# Patient Record
Sex: Female | Born: 1975 | Race: Black or African American | Hispanic: No | Marital: Married
Health system: Southern US, Community
[De-identification: ages and names within clinical notes are randomized; demographics above are authoritative.]

## PROBLEM LIST (undated history)

## (undated) DIAGNOSIS — F410 Panic disorder [episodic paroxysmal anxiety] without agoraphobia: Secondary | ICD-10-CM

## (undated) DIAGNOSIS — F419 Anxiety disorder, unspecified: Secondary | ICD-10-CM

## (undated) DIAGNOSIS — N911 Secondary amenorrhea: Secondary | ICD-10-CM

## (undated) DIAGNOSIS — E669 Obesity, unspecified: Secondary | ICD-10-CM

## (undated) HISTORY — DX: Secondary amenorrhea: N91.1

## (undated) HISTORY — DX: Panic disorder (episodic paroxysmal anxiety): F41.0

## (undated) HISTORY — DX: Obesity, unspecified: E66.9

## (undated) HISTORY — DX: Anxiety disorder, unspecified: F41.9

---

## 1998-06-10 ENCOUNTER — Emergency Department (HOSPITAL_COMMUNITY): Admission: EM | Admit: 1998-06-10 | Discharge: 1998-06-10 | Payer: Self-pay | Admitting: Emergency Medicine

## 2001-09-07 ENCOUNTER — Emergency Department (HOSPITAL_COMMUNITY): Admission: EM | Admit: 2001-09-07 | Discharge: 2001-09-07 | Payer: Self-pay | Admitting: Emergency Medicine

## 2001-11-01 ENCOUNTER — Emergency Department (HOSPITAL_COMMUNITY): Admission: EM | Admit: 2001-11-01 | Discharge: 2001-11-01 | Payer: Self-pay | Admitting: Emergency Medicine

## 2003-02-16 ENCOUNTER — Emergency Department (HOSPITAL_COMMUNITY): Admission: EM | Admit: 2003-02-16 | Discharge: 2003-02-16 | Payer: Self-pay | Admitting: Emergency Medicine

## 2003-07-04 ENCOUNTER — Emergency Department (HOSPITAL_COMMUNITY): Admission: EM | Admit: 2003-07-04 | Discharge: 2003-07-05 | Payer: Self-pay | Admitting: Emergency Medicine

## 2004-08-26 ENCOUNTER — Emergency Department (HOSPITAL_COMMUNITY): Admission: EM | Admit: 2004-08-26 | Discharge: 2004-08-26 | Payer: Self-pay | Admitting: Emergency Medicine

## 2004-10-23 ENCOUNTER — Inpatient Hospital Stay (HOSPITAL_COMMUNITY): Admission: AD | Admit: 2004-10-23 | Discharge: 2004-10-24 | Payer: Self-pay | Admitting: Obstetrics and Gynecology

## 2004-10-27 ENCOUNTER — Inpatient Hospital Stay (HOSPITAL_COMMUNITY): Admission: AD | Admit: 2004-10-27 | Discharge: 2004-10-27 | Payer: Self-pay | Admitting: Obstetrics and Gynecology

## 2004-12-03 ENCOUNTER — Inpatient Hospital Stay (HOSPITAL_COMMUNITY): Admission: AD | Admit: 2004-12-03 | Discharge: 2004-12-03 | Payer: Self-pay | Admitting: Obstetrics and Gynecology

## 2004-12-04 ENCOUNTER — Inpatient Hospital Stay (HOSPITAL_COMMUNITY): Admission: AD | Admit: 2004-12-04 | Discharge: 2004-12-04 | Payer: Self-pay | Admitting: *Deleted

## 2004-12-29 ENCOUNTER — Inpatient Hospital Stay (HOSPITAL_COMMUNITY): Admission: AD | Admit: 2004-12-29 | Discharge: 2004-12-29 | Payer: Self-pay | Admitting: Obstetrics & Gynecology

## 2005-01-05 ENCOUNTER — Inpatient Hospital Stay (HOSPITAL_COMMUNITY): Admission: AD | Admit: 2005-01-05 | Discharge: 2005-01-05 | Payer: Self-pay | Admitting: Obstetrics and Gynecology

## 2005-01-29 ENCOUNTER — Inpatient Hospital Stay (HOSPITAL_COMMUNITY): Admission: AD | Admit: 2005-01-29 | Discharge: 2005-02-03 | Payer: Self-pay | Admitting: Obstetrics & Gynecology

## 2005-01-31 ENCOUNTER — Encounter (INDEPENDENT_AMBULATORY_CARE_PROVIDER_SITE_OTHER): Payer: Self-pay | Admitting: Specialist

## 2005-02-04 ENCOUNTER — Encounter: Admission: RE | Admit: 2005-02-04 | Discharge: 2005-03-06 | Payer: Self-pay | Admitting: Obstetrics & Gynecology

## 2005-03-07 ENCOUNTER — Encounter: Admission: RE | Admit: 2005-03-07 | Discharge: 2005-04-06 | Payer: Self-pay | Admitting: Obstetrics & Gynecology

## 2005-04-07 ENCOUNTER — Encounter: Admission: RE | Admit: 2005-04-07 | Discharge: 2005-05-04 | Payer: Self-pay | Admitting: Obstetrics & Gynecology

## 2005-05-05 ENCOUNTER — Encounter: Admission: RE | Admit: 2005-05-05 | Discharge: 2005-06-04 | Payer: Self-pay | Admitting: Obstetrics & Gynecology

## 2005-06-05 ENCOUNTER — Encounter: Admission: RE | Admit: 2005-06-05 | Discharge: 2005-07-04 | Payer: Self-pay | Admitting: Obstetrics & Gynecology

## 2005-07-05 ENCOUNTER — Encounter: Admission: RE | Admit: 2005-07-05 | Discharge: 2005-08-04 | Payer: Self-pay | Admitting: Obstetrics & Gynecology

## 2005-08-05 ENCOUNTER — Encounter: Admission: RE | Admit: 2005-08-05 | Discharge: 2005-09-03 | Payer: Self-pay | Admitting: Obstetrics & Gynecology

## 2005-09-04 ENCOUNTER — Encounter: Admission: RE | Admit: 2005-09-04 | Discharge: 2005-10-04 | Payer: Self-pay | Admitting: Obstetrics & Gynecology

## 2005-10-05 ENCOUNTER — Encounter: Admission: RE | Admit: 2005-10-05 | Discharge: 2005-11-04 | Payer: Self-pay | Admitting: Obstetrics & Gynecology

## 2005-11-05 ENCOUNTER — Encounter: Admission: RE | Admit: 2005-11-05 | Discharge: 2005-12-04 | Payer: Self-pay | Admitting: Obstetrics & Gynecology

## 2005-12-05 ENCOUNTER — Encounter: Admission: RE | Admit: 2005-12-05 | Discharge: 2006-01-04 | Payer: Self-pay | Admitting: Obstetrics & Gynecology

## 2006-01-05 ENCOUNTER — Encounter: Admission: RE | Admit: 2006-01-05 | Discharge: 2006-02-03 | Payer: Self-pay | Admitting: Obstetrics & Gynecology

## 2006-01-24 ENCOUNTER — Emergency Department (HOSPITAL_COMMUNITY): Admission: EM | Admit: 2006-01-24 | Discharge: 2006-01-24 | Payer: Self-pay | Admitting: Emergency Medicine

## 2006-02-04 ENCOUNTER — Encounter: Admission: RE | Admit: 2006-02-04 | Discharge: 2006-03-06 | Payer: Self-pay | Admitting: Obstetrics & Gynecology

## 2006-03-07 ENCOUNTER — Encounter: Admission: RE | Admit: 2006-03-07 | Discharge: 2006-04-06 | Payer: Self-pay | Admitting: Obstetrics & Gynecology

## 2006-06-09 ENCOUNTER — Ambulatory Visit: Payer: Self-pay | Admitting: Family Medicine

## 2006-06-10 ENCOUNTER — Other Ambulatory Visit: Admission: RE | Admit: 2006-06-10 | Discharge: 2006-06-10 | Payer: Self-pay | Admitting: Family Medicine

## 2007-12-20 ENCOUNTER — Emergency Department (HOSPITAL_COMMUNITY): Admission: EM | Admit: 2007-12-20 | Discharge: 2007-12-21 | Payer: Self-pay | Admitting: Emergency Medicine

## 2008-01-24 ENCOUNTER — Emergency Department (HOSPITAL_COMMUNITY): Admission: EM | Admit: 2008-01-24 | Discharge: 2008-01-24 | Payer: Self-pay | Admitting: Emergency Medicine

## 2008-02-03 ENCOUNTER — Emergency Department (HOSPITAL_COMMUNITY): Admission: EM | Admit: 2008-02-03 | Discharge: 2008-02-03 | Payer: Self-pay | Admitting: Emergency Medicine

## 2008-02-09 ENCOUNTER — Emergency Department (HOSPITAL_COMMUNITY): Admission: EM | Admit: 2008-02-09 | Discharge: 2008-02-09 | Payer: Self-pay | Admitting: Emergency Medicine

## 2008-07-30 ENCOUNTER — Emergency Department (HOSPITAL_COMMUNITY): Admission: EM | Admit: 2008-07-30 | Discharge: 2008-07-30 | Payer: Self-pay | Admitting: Emergency Medicine

## 2008-08-30 ENCOUNTER — Emergency Department (HOSPITAL_COMMUNITY): Admission: EM | Admit: 2008-08-30 | Discharge: 2008-08-30 | Payer: Self-pay | Admitting: Emergency Medicine

## 2008-09-06 ENCOUNTER — Emergency Department (HOSPITAL_COMMUNITY): Admission: EM | Admit: 2008-09-06 | Discharge: 2008-09-06 | Payer: Self-pay | Admitting: Family Medicine

## 2010-03-14 ENCOUNTER — Ambulatory Visit: Admit: 2010-03-14 | Payer: Self-pay | Admitting: Family Medicine

## 2010-03-14 ENCOUNTER — Ambulatory Visit: Payer: 59 | Admitting: Physician Assistant

## 2010-03-14 DIAGNOSIS — Z3009 Encounter for other general counseling and advice on contraception: Secondary | ICD-10-CM

## 2010-03-14 DIAGNOSIS — E669 Obesity, unspecified: Secondary | ICD-10-CM

## 2010-03-14 DIAGNOSIS — R6882 Decreased libido: Secondary | ICD-10-CM

## 2010-03-14 DIAGNOSIS — Z0000 Encounter for general adult medical examination without abnormal findings: Secondary | ICD-10-CM

## 2010-04-20 ENCOUNTER — Emergency Department (HOSPITAL_COMMUNITY)
Admission: EM | Admit: 2010-04-20 | Discharge: 2010-04-20 | Disposition: A | Discharge status: Home / Self Care | Payer: 59 | Attending: Emergency Medicine | Admitting: Emergency Medicine

## 2010-04-20 DIAGNOSIS — K089 Disorder of teeth and supporting structures, unspecified: Secondary | ICD-10-CM | POA: Insufficient documentation

## 2010-04-20 DIAGNOSIS — K029 Dental caries, unspecified: Secondary | ICD-10-CM | POA: Insufficient documentation

## 2010-05-21 LAB — RAPID STREP SCREEN (MED CTR MEBANE ONLY): Streptococcus, Group A Screen (Direct): NEGATIVE

## 2010-06-06 ENCOUNTER — Other Ambulatory Visit (INDEPENDENT_AMBULATORY_CARE_PROVIDER_SITE_OTHER): Payer: 59

## 2010-06-06 DIAGNOSIS — Z3009 Encounter for other general counseling and advice on contraception: Secondary | ICD-10-CM

## 2010-06-29 NOTE — Op Note (Signed)
NAME:  Cathy Flores, Cathy Flores                 ACCOUNT NO.:  [id number]   MEDICAL RECORD NO.:  [id number]          PATIENT TYPE:  INP   LOCATION:  9123                          FACILITY:  [facility]   PHYSICIAN:  Maxie Better, M.D.DATE OF BIRTH:  16-May-1975   DATE OF PROCEDURE:  01/31/2005  DATE OF DISCHARGE:                                 OPERATIVE REPORT   PREOPERATIVE DIAGNOSES:  1.  Non-reassuring fetal tracing.  2.  Post dates.   OPERATION/PROCEDURE:  1.  Emergency primary cesarean section.  2.  Removal of omental lesion.   POSTOPERATIVE DIAGNOSES:  1.  Non-reassuring fetal tracing.  2.  Post dates.  3.  Hyperextended right occiput posterior presentation.   ANESTHESIA:  Epidural.   SURGEON:  Maxie Better, M.D.   ASSISTANT:  Hal Morales, M.D.   OPERATION/PROCEDURE:  Under adequate epidural anesthesia, the patient was  placed in the supine position with a right lateral tilt.  On arrival in the  operating room, fetal heart rate was back up to about 110-120's and  vacillating down to the 100's.  Prior to transfer to the operating room, the  fetal heart rate is in the 60's.  An indwelling Foley catheter was already  in place.  The patient had been  sterilely prepped and draped quickly.  Pfannenstiel skin incision was then made, carried down to the rectus fascia.  Rectus fascia was incised in the midline, extended transversely.  The rectus  fascia was then bluntly and sharply dissected off the rectus muscle in  superior and inferior fashion.  The rectus muscle was split in the midline.  The parietal peritoneum was entered sharply and extended.  The vesicouterine  peritoneum then opened.  Well-developed  lower uterine segment was noted.  A  curvilinear low transverse uterine incision was then made and extended with  bandage scissors.  Subsequent delivery of a live female from the right  occiput posterior presentation with hyperextended vertex was accomplished.  The baby was  bulb-suctioned on the abdomen.  Cord was clamped, cut and the  baby was transferred to the awaiting pediatrician who assigned Apgars of 8  and 9 at one and five minutes.  Cord PH was obtained which was 7.23.  Weight  of the baby was 7 pounds 2 ounces.  The placenta was spontaneous and intact.  The uterine cavity was cleaned of debris.  Normal tubes and ovaries were  noted bilaterally.  The uterine incision was closed with in two layers, the  first layer with 0 Monocryl, running locked stitch.  Second layer was  imbricating using 0 Monocryl suture.  Good hemostasis was noted.  The  abdomen was then copiously irrigated and suctioned of debris.  The right  omentum presented in the field and had a pearl-shaped 1.5 cm nodule which  was subsequently removed separately and sent to pathology.  The parietal  peritoneum was then closed.  The rectus fascia was closed with 0 Vicryl x2.  The subcutaneous area was irrigated and suctioned and small bleeders  cauterized.  The subcutaneous area was approximated using interrupted 2-0  plain  sutures.  The skin was approximated using Ethicon staples.   Specimen was placenta sent to pathology as well as the omental nodule.  Intraoperative fluid was 1200 mL.  Urine output was 300 mL.  Estimated blood  loss was 600 mL.  No complications.  The patient tolerated the procedure  well and was transferred to the recovery room in stable condition.  The  patient underwent x-ray due to the emergency setting.      Maxie Better, M.D.  Electronically Signed     [place]/MEDQ  D:  01/31/2005  T:  02/01/2005  Job:  [id number]

## 2010-06-29 NOTE — Discharge Summary (Signed)
[name]                 ACCOUNT NO.:  [id number]   MEDICAL RECORD NO.:  [id number]          PATIENT TYPE:  INP   LOCATION:  9123                          FACILITY:  [facility]   PHYSICIAN:  Maxie Better, M.D.DATE OF BIRTH:  05/05/1975   DATE OF ADMISSION:  01/29/2005  DATE OF DISCHARGE:  02/03/2005                                 DISCHARGE SUMMARY   ADMISSION DIAGNOSES:  1.  Post dates.  2.  Exogenous obesity.   DISCHARGE DIAGNOSES:  1.  Post date delivered.  2.  Postoperative anemia.  3.  Exogenous obesity.   PROCEDURE:  Emergency primary cesarean section with removal of an omental  lesion.   HISTORY OF PRESENT ILLNESS:  A 35 year old G1, P0 female at 31 5/7 weeks,  admitted by Dr. Seymour Bars for induction of labor, secondary to post dates.   HOSPITAL COURSE:  The patient was admitted to [facility].  At that  time, her cervix was 1, 60%, -3.  She had a reactive nonstress test.  Cervidil was placed for cervical ripening and on 01/30/05, the Cervidil was  removed, Pitocin was started and the patient was continued on her Pitocin.  Artificial rupture of membranes was performed.  The cervix at that time was  2, 70%, -2.  The fluid was clear.  Intrauterine pressure catheter was  subsequently placed.  The findings was persistent latent phase secondary to  suboptimal contractions.  The patient ultimately progressed to 6-7 cm, 80%,  -1.  She was having some intermittent variable decelerations at that time.  The patient subsequently had a fetal heart rate deceleration down to the 90s  from a baseline of 117 to 120, which was not responsive to positional  change, abdominal stimulation, scalp stimulation and subsequently went on to  decrease to the 60s.  The decision was made to perform an emergency primary  cesarean section.  The patient was taken to the operating room.  She had a  primary low transverse cesarean section, which resulted in the delivery of a  7 pound 2 ounce  live female from the occiput posterior presentation with a  hyperextended head.  Normal 2's and O's were noted at the time.  Apgars of 8  and 9.  Cord pH of 7.23.  There was a 1.5 cm pearl like nodule removed from  the omentum and sent to pathology.  The patient is known to be Rh negative.  RhoGAM was given.  The pathology showed the nonadipose tissue with a rim of  dense fibrous tissue for the omental lesion.  The placenta, which had been  sent, showed acute chorioamnionitis.  The patient had an uncomplicated  postoperative course.  A CBC on postoperative day #1 showed a hemoglobin of  9.4, hematocrit 27.7, white count 24.1.  By postoperative day #3, the  patient was afebrile, tolerating a regular diet.  The incision showed no  erythema, induration or exudate.  She was deemed well enough to be  discharged.   DISPOSITION:  Home.   CONDITION:  Stable.   DISCHARGE MEDICATIONS:  1.  Tylox 1-2 tablets q.4-6h.  2.  Over-the-counter Motrin 600 mg, 1 q.6h. p.r.n. pain.  3.  Prenatal vitamins, 1 p.o. daily.  4.  Over-the-counter iron supplementation, 1 p.o. daily.   DISCHARGE INSTRUCTIONS:  Per the postpartum booklet given to the patient and  reviewed with the patient.   FOLLOWUP APPOINTMENT:  For staple removal in the office on Tuesday or  Wednesday of the following week and a regular postpartum appointment in 4-6  weeks.      Maxie Better, M.D.  Electronically Signed     [place]/MEDQ  D:  02/17/2005  T:  02/18/2005  Job:  [id number]

## 2010-08-24 ENCOUNTER — Encounter: Payer: Self-pay | Admitting: Family Medicine

## 2010-08-28 ENCOUNTER — Other Ambulatory Visit: Payer: 59

## 2010-09-26 ENCOUNTER — Ambulatory Visit: Payer: 59 | Admitting: Medical

## 2010-11-09 ENCOUNTER — Ambulatory Visit: Payer: 59 | Admitting: Medical

## 2010-12-21 ENCOUNTER — Encounter: Payer: Self-pay | Admitting: Medical

## 2010-12-21 ENCOUNTER — Ambulatory Visit (INDEPENDENT_AMBULATORY_CARE_PROVIDER_SITE_OTHER): Payer: 59 | Admitting: Medical

## 2010-12-21 ENCOUNTER — Telehealth: Payer: Self-pay | Admitting: Medical

## 2010-12-21 ENCOUNTER — Ambulatory Visit
Admission: RE | Admit: 2010-12-21 | Discharge: 2010-12-21 | Disposition: A | Discharge status: Home / Self Care | Payer: 59 | Source: Ambulatory Visit | Attending: Medical | Admitting: Medical

## 2010-12-21 VITALS — BP 100/80 | HR 114 | Temp 98.7°F | Resp 20 | Wt 270.0 lb

## 2010-12-21 DIAGNOSIS — R059 Cough, unspecified: Secondary | ICD-10-CM | POA: Insufficient documentation

## 2010-12-21 DIAGNOSIS — R05 Cough: Secondary | ICD-10-CM

## 2010-12-21 DIAGNOSIS — R042 Hemoptysis: Secondary | ICD-10-CM

## 2010-12-21 LAB — CBC WITH DIFFERENTIAL/PLATELET
HCT: 38.5 % (ref 36.0–46.0)
Hemoglobin: 12.1 g/dL (ref 12.0–15.0)
Lymphocytes Relative: 12 % (ref 12–46)
Lymphs Abs: 3 10*3/uL (ref 0.7–4.0)
MCH: 28.9 pg (ref 26.0–34.0)
MCHC: 31.5 g/dL (ref 30.0–36.0)
MCV: 91.9 fL (ref 78.0–100.0)
Monocytes Absolute: 0.5 10*3/uL (ref 0.1–1.0)
Monocytes Relative: 2 % — ABNORMAL LOW (ref 3–12)
Neutrophils Relative %: 86 % — ABNORMAL HIGH (ref 43–77)
Platelets: 276 10*3/uL (ref 150–400)
RBC: 4.19 MIL/uL (ref 3.87–5.11)

## 2010-12-21 MED ORDER — LEVOFLOXACIN 500 MG PO TABS: 500.0000 mg | ORAL_TABLET | Freq: Every day | ORAL | Status: AC

## 2010-12-21 MED ORDER — HYDROCODONE-HOMATROPINE 5-1.5 MG/5ML PO SYRP: 5.0000 mL | ORAL_SOLUTION | Freq: Four times a day (QID) | ORAL | Status: AC | PRN

## 2010-12-21 NOTE — Patient Instructions (Signed)

## 2010-12-21 NOTE — Progress Notes (Signed)
Subjective:   HPI  Cathy Flores is a 35 y.o. female who presents with two-week history of cough that is worsening.  She has been in her usual state of health and usually doesn't frequent the doctor's office up until the last 2 weeks. She has been using over-the-counter Delsym and expectorant, has been getting up some sputum, but today she's had 2 episodes were she saw a small amount of somewhat bright and liquid red blood in with the sputum.  She also notes notes subjective fever, sweats, feeling cold and hot, achy, a little short of breath and wheezing. Her daughter has been sick with a cold. Her throat feels like it's on fire, she does have some ear pressure and sinus pressure and headache. She denies use of recent NSAIDs, no recent long travel, no calf pain, no recent surgery or procedure, not on hormone therapy, no history of DVT or PE.  No other aggravating or relieving factors.    No other c/o.  The following portions of the patient's history were reviewed and updated as appropriate: allergies, current medications, past family history, past medical history, past social history, past surgical history and problem list.  No past medical history on file.   Review of Systems Constitutional: _+fever, +chills, +sweats, -unexpected -weight change,+fatigue ENT: +runny nose, -ear pain, +sore throat Cardiology:  -chest pain, -palpitations, -edema Respiratory: +cough, -shortness of breath, +wheezing Gastroenterology: -abdominal pain, -nausea, +vomiting, -diarrhea, -constipation Hematology: -bleeding or bruising problems Musculoskeletal: -arthralgias, -myalgias, -joint swelling, -back pain Ophthalmology: -vision changes Urology: -dysuria, -difficulty urinating, -hematuria, -urinary frequency, -urgency Neurology: +headache, -weakness, -tingling, -numbness    Objective:   Physical Exam  Filed Vitals:   12/21/10 1335  BP: 100/60  Pulse: 102  Temp: 98.7 F (37.1 C)  Resp: 20    General  appearance: alert, no distress, WD/WN, obese black female HEENT: normocephalic, sclerae anicteric, TMs pearly, nares with swollen turbinates and some mucopurulent discharge and erythema, pharynx with mild erythema Oral cavity: MMM, no lesions Neck: supple, no lymphadenopathy, no thyromegaly, no masses Heart: RRR, normal S1, S2, no murmurs Lungs:+rales and rhonchi right lower lung fields, otherwise decreased breath sounds in general Ext: no calve tenderness Pulses: 2+ symmetric, upper and lower extremities, normal cap refill   Assessment and Plan :    Encounter Diagnoses  Name Primary?  . Cough Yes  . Blood in sputum    Orthostatics vitals not worrisome, Pulse ox 93% average.  Will send for chest xray now and CBC.  We'll treat empirically for pneumonia,, description for Hydromet cough syrup and Levaquin, advise rest, supportive care, recheck in 7-10 days.

## 2010-12-28 NOTE — Telephone Encounter (Signed)
FYI. CLOSED OVER 1 WK OLD

## 2010-12-28 NOTE — Progress Notes (Signed)
12/28/10-LMOM FOR THE PATIENT TO CB. CLS X3 CALLING

## 2011-01-04 NOTE — Progress Notes (Signed)
I TRIED TO CALL THIS PATIENT THREE TIMES TO MAKE SURE THAT SHE WAS DOING OKAY AND TO F/U WITH SHANE BUT NO RETURN CALL FROM THE PATIENT. CLS

## 2011-03-01 ENCOUNTER — Institutional Professional Consult (permissible substitution): Payer: 59 | Admitting: Medical

## 2011-03-13 ENCOUNTER — Institutional Professional Consult (permissible substitution): Payer: 59 | Admitting: Medical

## 2011-05-29 ENCOUNTER — Ambulatory Visit (INDEPENDENT_AMBULATORY_CARE_PROVIDER_SITE_OTHER): Payer: 59 | Admitting: Medical

## 2011-05-29 ENCOUNTER — Encounter: Payer: Self-pay | Admitting: Medical

## 2011-05-29 VITALS — BP 120/70 | HR 68 | Temp 98.5°F | Resp 16 | Wt 277.0 lb

## 2011-05-29 DIAGNOSIS — F430 Acute stress reaction: Secondary | ICD-10-CM

## 2011-05-29 DIAGNOSIS — F438 Other reactions to severe stress: Secondary | ICD-10-CM

## 2011-05-29 MED ORDER — ALPRAZOLAM 0.25 MG PO TABS: ORAL_TABLET | ORAL | Status: DC

## 2011-05-29 MED ORDER — CITALOPRAM HYDROBROMIDE 20 MG PO TABS: ORAL_TABLET | ORAL | Status: DC

## 2011-05-29 NOTE — Progress Notes (Signed)
Subjective: Here for c/o panic attacks.  She notes having panic attacks last 4 months.  First one was middle of December 2012, but started having panic attacks more frequency.  The last 2 weeks has had 2-3 panic attacks per week.  Getting bad headaches as well. She will sit there and cry and can't open eyes.  Past 30 days feels like headache continuously.  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  2 wk ago had one, felt faint like she was going to fall out of chair at work.  She only gets these panic episodes and headaches at work.  She notes that she works 55-60 hrs per week, mandatory overtime, lots of stress, lots of demands by supervisors that are unrealistic.   Always threatened with being written up or job loss if not meeting goals and keeping customer service phone calls under .  She does note hx/o depression 3 years ago.   Was seeing counseling regularly then.  She has also used employee assistance program for counseling in the past and of recent too.  They recommended medication to help the panic episodes.  She notes that she basically goes to work, feels anxiety just entering the parking lot at work, comes home sleeps, and gets up to do it all over again.  Takes 300 calls daily.  Her and coworkers have went to supervisors for concerns but get no where with compromise other than threats of reprimand if not meeting goals.   Objective: Gen: crying, upset, seems frustrated   Assessment: Encounter Diagnosis  Name Primary?  . Acute stress reaction Yes    Plan: Discussed concerns, empathized with the stressful work environment that is the primary issue. Discussed ways to deal with anxiety, panic attacks, and counseled on ways to cope and address concerns with supervisors.  She will begin Citalopram QHS, Xanax for short term prn use.   She will recheck 2wk, sooner prn.  Spent 30 min face to face.

## 2011-05-29 NOTE — Patient Instructions (Signed)

## 2011-06-07 ENCOUNTER — Other Ambulatory Visit: Payer: Self-pay | Admitting: Medical

## 2011-06-07 ENCOUNTER — Telehealth: Payer: Self-pay | Admitting: Internal Medicine

## 2011-06-07 MED ORDER — HYDROCODONE-ACETAMINOPHEN 5-500 MG PO TABS: 1.0000 | ORAL_TABLET | Freq: Four times a day (QID) | ORAL | Status: AC | PRN

## 2011-06-07 NOTE — Telephone Encounter (Signed)
pt walked in and asked if she could be seen or if you could prescribe her some medicine for her migraines. pt was told you were booked today and that she could try over the counter excedrin migraine to help with that and we would get back to her later on. [facility] [address]. call pt [phone].

## 2011-06-07 NOTE — Telephone Encounter (Signed)
lortab called out for bad migraine.  She has f/u here next week

## 2011-06-07 NOTE — Telephone Encounter (Signed)
Pt also has intermittent FMLA forms to be filled out due to her panic attacks and migraines

## 2011-06-11 ENCOUNTER — Ambulatory Visit: Payer: 59 | Admitting: Medical

## 2011-06-11 ENCOUNTER — Encounter: Payer: Self-pay | Admitting: Medical

## 2011-06-11 ENCOUNTER — Ambulatory Visit (INDEPENDENT_AMBULATORY_CARE_PROVIDER_SITE_OTHER): Payer: 59 | Admitting: Medical

## 2011-06-11 VITALS — BP 112/70 | HR 80 | Temp 98.8°F | Wt 277.0 lb

## 2011-06-11 DIAGNOSIS — F410 Panic disorder [episodic paroxysmal anxiety] without agoraphobia: Secondary | ICD-10-CM

## 2011-06-11 DIAGNOSIS — N912 Amenorrhea, unspecified: Secondary | ICD-10-CM

## 2011-06-11 DIAGNOSIS — F419 Anxiety disorder, unspecified: Secondary | ICD-10-CM

## 2011-06-11 DIAGNOSIS — R51 Headache: Secondary | ICD-10-CM

## 2011-06-11 DIAGNOSIS — R519 Headache, unspecified: Secondary | ICD-10-CM | POA: Insufficient documentation

## 2011-06-11 DIAGNOSIS — F411 Generalized anxiety disorder: Secondary | ICD-10-CM

## 2011-06-11 DIAGNOSIS — N911 Secondary amenorrhea: Secondary | ICD-10-CM | POA: Insufficient documentation

## 2011-06-11 NOTE — Progress Notes (Signed)
Subjective: Here for 2 wk recheck on anxiety.   Last visit we discussed her concerns of stress at work leading to overwhelming feeling and panic attacks.  Last visit we started her on citalopram and gave Xanax for prn use for panic.   Since last visit she does note improvement in overall ability to deal with the stressors.  Has had 2 panic episodes compared to more frequent episodes. She only had to use Xanax once.  She is tolerating the citalopram ok.    The biggest difference is that she is sleeping ok now.  She was having difficulty with this prior.    The other big concern she has is headaches.  She notes dealing with both anxiety and stress related headaches for the past 6 months.  The headache typically are frontal, throbbing, with photophobia, feels pressure like a pin prick would release the pressure.  Gets some tearing bilat with the headaches.  The headaches are "numbing" all over.  Denies vision changes, allergy problems, unilateal weakness numbness or tingling.  No nausea or vomiting.  The headaches are somewhat frequent, 1-3 per week, sometimes debilitating including the bad headache she had last Friday when she called in here. Lortab was sent which helped.    She wants FMLA completed so she doesn't lose her job.  On average she has been missing 2-3 days per month from work due to the anxiety and headaches.    Since last visit she has begun walking for exercise.   She also notes itching all over intermittently, using benadryl daily.  She also went to eye doctor recently due to the headaches, but exam was normal.  On a separate note, she notes no period in 3 years, not on birth control.    Past Medical History  Diagnosis Date  . Anxiety   . Panic disorder   . Obesity   . Secondary amenorrhea    ROS as noted above in HPI  Objective: Gen: WD, WN, nad, obese black female Psych: pleasant, not crying today, less anxious appearing today  Assessment: Encounter Diagnoses  Name Primary?    [place] Anxiety Yes  . Panic disorder   . Headache   . Secondary amenorrhea    Plan: Anxiety and panic - improving.  C/t Citalopram 20mg  daily, Xanax prn, counseled on ways to deal with the stress of work and panic episodes, c/t exercise, and recheck 764mo.  Will likely increase to 40mg  Citalopram next visit.   Headache - discussed ways to reduce headache triggers, use Bayer or Excedrin migraine OTC prn.   Secondary amenorrhea - could be multifactorial.  Will return to discus further.  RTC 764mo for physical, fasting labs, and recheck.

## 2011-06-12 ENCOUNTER — Telehealth: Payer: Self-pay | Admitting: Medical

## 2011-06-12 NOTE — Telephone Encounter (Signed)
LM

## 2011-07-29 ENCOUNTER — Encounter: Payer: 59 | Admitting: Medical

## 2012-11-03 DIAGNOSIS — B20 Human immunodeficiency virus [HIV] disease: Secondary | ICD-10-CM

## 2013-05-13 DIAGNOSIS — B20 Human immunodeficiency virus [HIV] disease: Secondary | ICD-10-CM

## 2015-05-17 ENCOUNTER — Other Ambulatory Visit (HOSPITAL_COMMUNITY)
Admission: RE | Admit: 2015-05-17 | Discharge: 2015-05-17 | Disposition: A | Discharge status: Home / Self Care | Payer: 59 | Source: Ambulatory Visit | Attending: Family Medicine | Admitting: Family Medicine

## 2015-05-17 ENCOUNTER — Other Ambulatory Visit: Payer: Self-pay

## 2015-05-17 DIAGNOSIS — Z01419 Encounter for gynecological examination (general) (routine) without abnormal findings: Secondary | ICD-10-CM | POA: Diagnosis not present

## 2015-05-19 LAB — CYTOLOGY - PAP

## 2015-10-09 ENCOUNTER — Ambulatory Visit (INDEPENDENT_AMBULATORY_CARE_PROVIDER_SITE_OTHER): Payer: Worker's Compensation

## 2015-10-09 ENCOUNTER — Ambulatory Visit (INDEPENDENT_AMBULATORY_CARE_PROVIDER_SITE_OTHER): Payer: Worker's Compensation | Admitting: Physician Assistant

## 2015-10-09 DIAGNOSIS — M542 Cervicalgia: Secondary | ICD-10-CM

## 2015-10-09 DIAGNOSIS — Z0289 Encounter for other administrative examinations: Secondary | ICD-10-CM

## 2015-10-09 DIAGNOSIS — R071 Chest pain on breathing: Secondary | ICD-10-CM

## 2015-10-09 DIAGNOSIS — R51 Headache: Secondary | ICD-10-CM

## 2015-10-09 DIAGNOSIS — R0789 Other chest pain: Secondary | ICD-10-CM

## 2015-10-09 DIAGNOSIS — R519 Headache, unspecified: Secondary | ICD-10-CM

## 2015-10-09 MED ORDER — NAPROXEN 500 MG PO TABS: 500.0000 mg | ORAL_TABLET | Freq: Two times a day (BID) | ORAL | 0 refills | Status: DC

## 2015-10-09 MED ORDER — CYCLOBENZAPRINE HCL 5 MG PO TABS: 5.0000 mg | ORAL_TABLET | Freq: Three times a day (TID) | ORAL | 0 refills | Status: DC | PRN
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
## 2015-10-20 ENCOUNTER — Ambulatory Visit (INDEPENDENT_AMBULATORY_CARE_PROVIDER_SITE_OTHER): Payer: Worker's Compensation | Admitting: Family Medicine

## 2015-10-20 DIAGNOSIS — R519 Headache, unspecified: Secondary | ICD-10-CM

## 2015-10-20 DIAGNOSIS — S139XXD Sprain of joints and ligaments of unspecified parts of neck, subsequent encounter: Secondary | ICD-10-CM

## 2015-10-20 DIAGNOSIS — R51 Headache: Secondary | ICD-10-CM | POA: Diagnosis not present
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
## 2016-09-11 ENCOUNTER — Encounter: Payer: Self-pay | Admitting: Physician Assistant

## 2016-09-11 ENCOUNTER — Ambulatory Visit (INDEPENDENT_AMBULATORY_CARE_PROVIDER_SITE_OTHER): Payer: 59 | Admitting: Physician Assistant

## 2016-09-11 VITALS — BP 116/83 | HR 65 | Temp 98.6°F | Resp 18 | Ht 64.57 in | Wt 281.0 lb

## 2016-09-11 DIAGNOSIS — Z713 Dietary counseling and surveillance: Secondary | ICD-10-CM

## 2016-09-11 DIAGNOSIS — Z1329 Encounter for screening for other suspected endocrine disorder: Secondary | ICD-10-CM

## 2016-09-11 DIAGNOSIS — G4482 Headache associated with sexual activity: Secondary | ICD-10-CM

## 2016-09-11 DIAGNOSIS — Z124 Encounter for screening for malignant neoplasm of cervix: Secondary | ICD-10-CM | POA: Diagnosis not present

## 2016-09-11 DIAGNOSIS — Z0000 Encounter for general adult medical examination without abnormal findings: Secondary | ICD-10-CM

## 2016-09-11 DIAGNOSIS — Z30019 Encounter for initial prescription of contraceptives, unspecified: Secondary | ICD-10-CM | POA: Diagnosis not present

## 2016-09-11 DIAGNOSIS — Z114 Encounter for screening for human immunodeficiency virus [HIV]: Secondary | ICD-10-CM

## 2016-09-11 DIAGNOSIS — Z13220 Encounter for screening for lipoid disorders: Secondary | ICD-10-CM | POA: Diagnosis not present

## 2016-09-11 DIAGNOSIS — Z113 Encounter for screening for infections with a predominantly sexual mode of transmission: Secondary | ICD-10-CM | POA: Diagnosis not present

## 2016-09-11 DIAGNOSIS — Z130 Encounter for screening for diseases of the blood and blood-forming organs and certain disorders involving the immune mechanism: Secondary | ICD-10-CM

## 2016-09-11 DIAGNOSIS — Z13228 Encounter for screening for other metabolic disorders: Secondary | ICD-10-CM | POA: Diagnosis not present

## 2016-09-11 NOTE — Progress Notes (Addendum)
PRIMARY CARE AT [facility] [address], [place] [address] [phone]  Date:  09/11/2016   Name:  Cathy Flores   DOB:  [birth year]   MRN:  [id number]  PCP:  Denita Lung, MD    History of Present Illness:  Cathy Flores is a 41 y.o. female patient who presents to PCP with  Chief Complaint  Patient presents with  . Annual Exam    with PAP     DIET: no sweets.  Vegetables daily--such as green beans, corn peas.  She is trying to eat before 8pm.  Water intake consist 98oz   BM: normal.  No blood or black stool.  No constipation or diarrhea.  URINATION: normal.  No hematuria, abnormal frequency, dysuria.  SLEEP: Insomnia for the last 1.5 years.    SOCIAL ACTIVITY She goes to sports event for daughter.  Divorcing.   She started walking 3 months ago.  She has cut out sodas and juice.  carbs are not her friend.  She started eating breakfast, toast or fruit.   5lb from last weight 3 weeks ago.   Wt Readings from Last 3 Encounters:  09/11/16 281 lb (127.5 kg)  10/20/15 260 lb (117.9 kg)  10/09/15 263 lb (119.3 kg)   EtOH: 5 drinks per week Tobacco or vaping: none Illicit drug use: none  MENSES: Regular.  Normal bleeding, and without cramping.  With protection.  One partner. She will have a headache for 15 minutes with orgasms, 2 months ago.   No hx of migraines. Lmp: 08/27/2016 No hx of bleeding disorder.  She has tried the depo shot and the ocp successfully.  Patient would like to be started on birth control pills.   Patient denies dyspareunia, but states that on 5 occasions she had episodes of head pain with orgasm.  Associated dizziness and lingering pain that last for 15 minutes.   This started about 5 months ago, with last episode 2 months ago.   Colposcopy 2012, since normal pap Patient Active Problem List   Diagnosis Date Noted  . Anxiety 06/11/2011  . Panic disorder 06/11/2011  . Headache(784.0) 06/11/2011  . Secondary amenorrhea 06/11/2011  . Cough 12/21/2010   . Blood in sputum 12/21/2010    Past Medical History:  Diagnosis Date  . Anxiety   . Obesity   . Panic disorder   . Secondary amenorrhea     Past Surgical History:  Procedure Laterality Date  . CESAREAN SECTION      Social History  Substance Use Topics  . Smoking status: Never Smoker  . Smokeless tobacco: Never Used  . Alcohol use No    Family History  Problem Relation Age of Onset  . Blindness Father   . Heart disease Paternal Grandfather   . Arthritis Paternal Grandfather     No Known Allergies  Medication list has been reviewed and updated.  No current outpatient prescriptions on file prior to visit.   No current facility-administered medications on file prior to visit.     ROS ROS otherwise unremarkable unless listed above.  Physical Examination: BP 116/83   Pulse 65   Temp 98.6 F (37 C) (Oral)   Resp 18   Ht 5' 4.57" (1.64 m)   Wt 281 lb (127.5 kg)   SpO2 95%   BMI 47.39 kg/m  Ideal Body Weight: Weight in (lb) to have BMI = 25: 147.9  Physical Exam  Constitutional: She is oriented to person, place, and time. She appears  well-developed and well-nourished. No distress.  HENT:  Head: Normocephalic and atraumatic.  Right Ear: Tympanic membrane, external ear and ear canal normal.  Left Ear: Tympanic membrane, external ear and ear canal normal.  Nose: Right sinus exhibits no maxillary sinus tenderness and no frontal sinus tenderness. Left sinus exhibits no maxillary sinus tenderness and no frontal sinus tenderness.  Mouth/Throat: Oropharynx is clear and moist. No uvula swelling. No oropharyngeal exudate, posterior oropharyngeal edema or posterior oropharyngeal erythema.  Eyes: Pupils are equal, round, and reactive to light. Conjunctivae and EOM are normal.  Neck: Normal range of motion. Neck supple. No thyromegaly present.  Cardiovascular: Normal rate, regular rhythm, normal heart sounds and intact distal pulses.  Exam reveals no gallop, no distant  heart sounds and no friction rub.   No murmur heard. Pulmonary/Chest: Effort normal and breath sounds normal. No respiratory distress. She has no decreased breath sounds. She has no wheezes. She has no rhonchi.  Abdominal: Soft. Bowel sounds are normal. She exhibits no distension and no mass. There is no tenderness.  Musculoskeletal: Normal range of motion. She exhibits no edema or tenderness.  Lymphadenopathy:       Head (right side): No submandibular, no tonsillar, no preauricular and no posterior auricular adenopathy present.       Head (left side): No submandibular, no tonsillar, no preauricular and no posterior auricular adenopathy present.    She has no cervical adenopathy.  Neurological: She is alert and oriented to person, place, and time. No cranial nerve deficit. She exhibits normal muscle tone. Coordination normal.  Skin: Skin is warm and dry. She is not diaphoretic.  Psychiatric: She has a normal mood and affect. Her behavior is normal.     Assessment and Plan: Allex Hilton Flores is a 41 y.o. female who is here today for cc of annual physical exam --annual physical exam --she will need imaging or evaluation for coitus headache.  --will start her on depo shot.  She wishes to have this done during her period which is advantageous.  Will need hcg urine at that time--likely in 3 weeks. .   Annual physical exam - Plan: CBC, CMP14+EGFR, TSH, Lipid panel, HIV antibody, RPR, Pap IG, CT/NG w/ reflex HPV when ASC-U  Screening for deficiency anemia - Plan: CBC  Screening for thyroid disorder - Plan: TSH  Screening for lipid disorders - Plan: Lipid panel  Screening for HIV (human immunodeficiency virus) - Plan: HIV antibody  Screening for STD (sexually transmitted disease) - Plan: HIV antibody, RPR  Screening for metabolic disorder - Plan: CMP14+EGFR  Screening for cervical cancer - Plan: Pap IG, CT/NG w/ reflex HPV when ASC-U  Orgasmic headache  Encounter for initial prescription  of contraceptives, unspecified contraceptive  Encounter for weight loss counseling  Ivar Drape, PA-C [facility] 8/1/20185:58 PM  Ivar Drape, PA-C [facility] 8/1/20183:14 PM

## 2016-09-11 NOTE — Patient Instructions (Addendum)
We will need to hold off on phentermine at this time.   I would like you to use the app, my fitnesspal, to record all the foods  You are taking.  It is helpful in giving you an understanding of calories, carbohydrates, and other parts of your diet that is a bit more detail then just portion control.   You are doing the right things with you exercise, so keep going.   I will arrange what we will do with these headache, as we are awaiting labs.   Keeping You Healthy  Get These Tests 1. Blood Pressure- Have your blood pressure checked once a year by your health care provider.  Normal blood pressure is 120/80. 2. Weight- Have your body mass index (BMI) calculated to screen for obesity.  BMI is measure of body fat based on height and weight.  You can also calculate your own BMI at https://www.west-esparza.com/www.nhlbisupport.com/bmi/. 3. Cholesterol- Have your cholesterol checked every 5 years starting at age 40 then yearly starting at age 40. 4. Chlamydia, HIV, and other sexually transmitted diseases- Get screened every year until age 40, then within three months of each new sexual provider. 5. Pap Test - Every 1-5 years; discuss with your health care provider. 6. Mammogram- Every 1-2 years starting at age 40--50  Take these medicines  Calcium with Vitamin D-Your body needs 1200 mg of Calcium each day and [phone] IU of Vitamin D daily.  Your body can only absorb 500 mg of Calcium at a time so Calcium must be taken in 2 or 3 divided doses throughout the day.  Multivitamin with folic acid- Once daily if it is possible for you to become pregnant.  Get these Immunizations  Gardasil-Series of three doses; prevents HPV related illness such as genital warts and cervical cancer.  Menactra-Single dose; prevents meningitis.  Tetanus shot- Every 10 years.  Flu shot-Every year.  Take these steps 1. Do not smoke-Your healthcare provider can help you quit.  For tips on how to quit go to www.smokefree.gov or call 1-800  QUITNOW. 2. Be physically active- Exercise 5 days a week for at least 30 minutes.  If you are not already physically active, start slow and gradually work up to 30 minutes of moderate physical activity.  Examples of moderate activity include walking briskly, dancing, swimming, bicycling, etc. 3. Breast Cancer- A self breast exam every month is important for early detection of breast cancer.  For more information and instruction on self breast exams, ask your healthcare provider or [place].eswww.womenshealth.gov/faq/breast-self-exam.cfm. 4. Eat a healthy diet- Eat a variety of healthy foods such as fruits, vegetables, whole grains, low fat milk, low fat cheeses, yogurt, lean meats, poultry and fish, beans, nuts, tofu, etc.  For more information go to www. Thenutritionsource.org 5. Drink alcohol in moderation- Limit alcohol intake to one drink or less per day. Never drink and drive. 6. Depression- Your emotional health is as important as your physical health.  If you're feeling down or losing interest in things you normally enjoy please talk to your healthcare provider about being screened for depression. 7. Dental visit- Brush and floss your teeth twice daily; visit your dentist twice a year. 8. Eye doctor- Get an eye exam at least every 2 years. 9. Helmet use- Always wear a helmet when riding a bicycle, motorcycle, rollerblading or skateboarding. 10. Safe sex- If you may be exposed to sexually transmitted infections, use a condom. 11. Seat belts- Seat belts can save your live; always wear one. 12.  Smoke/Carbon Monoxide detectors- These detectors need to be installed on the appropriate level of your home. Replace batteries at least once a year. 13. Skin cancer- When out in the sun please cover up and use sunscreen 15 SPF or higher. 14. Violence- If anyone is threatening or hurting you, please tell your healthcare provider.           IF you received an x-ray today, you will receive an invoice from  [facility]. Please contact [facility] at [phone] with questions or concerns regarding your invoice.   IF you received labwork today, you will receive an invoice from [facility]. Please contact [facility] at [phone] with questions or concerns regarding your invoice.   Our billing staff will not be able to assist you with questions regarding bills from these companies.  You will be contacted with the lab results as soon as they are available. The fastest way to get your results is to activate your My Chart account. Instructions are located on the last page of this paperwork. If you have not heard from us regarding the results in 2 weeks, please contact this office.

## 2016-09-11 NOTE — Progress Notes (Signed)
   Subjective:    Patient ID: Cathy Flores, female    DOB: 10/09/1975, 41 y.o.   MRN: [id number]  HPI    Review of Systems  Cardiovascular: Positive for leg swelling.       Objective:   Physical Exam        Assessment & Plan:

## 2016-09-12 LAB — HIV ANTIBODY (ROUTINE TESTING W REFLEX): HIV Screen 4th Generation wRfx: NONREACTIVE

## 2016-09-12 LAB — CMP14+EGFR
ALBUMIN: 3.7 g/dL (ref 3.5–5.5)
ALT: 14 IU/L (ref 0–32)
AST: 13 IU/L (ref 0–40)
Albumin/Globulin Ratio: 1 — ABNORMAL LOW (ref 1.2–2.2)
Alkaline Phosphatase: 79 IU/L (ref 39–117)
BUN/Creatinine Ratio: 9 (ref 9–23)
BUN: 8 mg/dL (ref 6–24)
Bilirubin Total: 0.3 mg/dL (ref 0.0–1.2)
CO2: 22 mmol/L (ref 20–29)
Calcium: 8.8 mg/dL (ref 8.7–10.2)
Chloride: 101 mmol/L (ref 96–106)
Creatinine, Ser: 0.88 mg/dL (ref 0.57–1.00)
GFR calc non Af Amer: 82 mL/min/{1.73_m2} (ref >59)
GFR, EST AFRICAN AMERICAN: 95 mL/min/{1.73_m2} (ref >59)
GLOBULIN, TOTAL: 3.7 g/dL (ref 1.5–4.5)
Glucose: 81 mg/dL (ref 65–99)
Potassium: 3.9 mmol/L (ref 3.5–5.2)
Sodium: 139 mmol/L (ref 134–144)
Total Protein: 7.4 g/dL (ref 6.0–8.5)

## 2016-09-12 LAB — TSH: TSH: 2.07 u[IU]/mL (ref 0.450–4.500)

## 2016-09-12 LAB — LIPID PANEL
CHOLESTEROL TOTAL: 152 mg/dL (ref 100–199)
Chol/HDL Ratio: 4.2 ratio (ref 0.0–4.4)
HDL: 36 mg/dL — ABNORMAL LOW (ref >39)
LDL CALC: 94 mg/dL (ref 0–99)
Triglycerides: 108 mg/dL (ref 0–149)
VLDL Cholesterol Cal: 22 mg/dL (ref 5–40)

## 2016-09-12 LAB — CBC
HEMATOCRIT: 39.2 % (ref 34.0–46.6)
Hemoglobin: 12.6 g/dL (ref 11.1–15.9)
MCH: 31.1 pg (ref 26.6–33.0)
MCHC: 32.1 g/dL (ref 31.5–35.7)
MCV: 97 fL (ref 79–97)
Platelets: 316 10*3/uL (ref 150–379)
RBC: 4.05 x10E6/uL (ref 3.77–5.28)
RDW: 13.2 % (ref 12.3–15.4)
WBC: 13.7 10*3/uL — AB (ref 3.4–10.8)

## 2016-09-12 LAB — RPR: RPR Ser Ql: NONREACTIVE

## 2016-09-16 LAB — PAP IG, CT-NG, RFX HPV ASCU
Chlamydia, Nuc. Acid Amp: NEGATIVE
Gonococcus by Nucleic Acid Amp: NEGATIVE
PAP Smear Comment: 0

## 2016-09-27 ENCOUNTER — Ambulatory Visit (INDEPENDENT_AMBULATORY_CARE_PROVIDER_SITE_OTHER): Payer: 59 | Admitting: Family Medicine

## 2016-09-27 ENCOUNTER — Encounter: Payer: Self-pay | Admitting: Family Medicine

## 2016-09-27 VITALS — BP 115/77 | HR 71 | Temp 98.8°F | Resp 18 | Ht 64.57 in | Wt 282.4 lb

## 2016-09-27 DIAGNOSIS — Z3042 Encounter for surveillance of injectable contraceptive: Secondary | ICD-10-CM | POA: Diagnosis not present

## 2016-09-27 LAB — POCT URINE PREGNANCY: Preg Test, Ur: NEGATIVE

## 2016-09-27 MED ORDER — MEDROXYPROGESTERONE ACETATE 150 MG/ML IM SUSP: 150.0000 mg | INTRAMUSCULAR | 3 refills | Status: DC

## 2016-09-27 MED ORDER — MEDROXYPROGESTERONE ACETATE 150 MG/ML IM SUSP
150.0000 mg | Freq: Once | INTRAMUSCULAR | Status: AC
  Administered 2016-09-27: 150 mg via INTRAMUSCULAR

## 2016-09-27 NOTE — Patient Instructions (Signed)
     IF you received an x-ray today, you will receive an invoice from [facility]. Please contact [facility] at [phone] with questions or concerns regarding your invoice.   IF you received labwork today, you will receive an invoice from [facility]. Please contact [facility] at [phone] with questions or concerns regarding your invoice.   Our billing staff will not be able to assist you with questions regarding bills from these companies.  You will be contacted with the lab results as soon as they are available. The fastest way to get your results is to activate your My Chart account. Instructions are located on the last page of this paperwork. If you have not heard from us regarding the results in 2 weeks, please contact this office.     

## 2016-09-27 NOTE — Progress Notes (Signed)
   8/17/20184:48 PM  Chelcee Barnett Abu August 25, 1975, 41 y.o. female [id number]  Chief Complaint  Patient presents with  . Contraception    Depo shot    HPI:   Patient is a 40 y.o. female who presents today requesting to be started on depo provera for [facility]. Patient reports having used it successfully years ago. LMP 09/25/15. Reports monthly menses that are 3 days, light flow. Has no other concerns today.  Depression screen [facility] 2/9 09/27/2016 09/11/2016 10/20/2015  Decreased Interest 0 0 0  Down, Depressed, Hopeless 0 0 0  PHQ - 2 Score 0 0 0    No Known Allergies  No current outpatient prescriptions on file prior to visit.   No current facility-administered medications on file prior to visit.     Past Medical History:  Diagnosis Date  . Anxiety   . Obesity   . Panic disorder   . Secondary amenorrhea     Past Surgical History:  Procedure Laterality Date  . CESAREAN SECTION      Social History  Substance Use Topics  . Smoking status: Never Smoker  . Smokeless tobacco: Never Used  . Alcohol use No    Family History  Problem Relation Age of Onset  . Blindness Father   . Heart disease Paternal Grandfather   . Arthritis Paternal Grandfather     Review of Systems  Constitutional: Negative for chills and fever.     OBJECTIVE:  Blood pressure 115/77, pulse 71, temperature 98.8 F (37.1 C), temperature source Oral, resp. rate 18, height 5' 4.57" (1.64 m), weight 282 lb 6.4 oz (128.1 kg), last menstrual period 09/24/2016, SpO2 96 %.  Physical Exam  Constitutional: She is oriented to person, place, and time and well-developed, well-nourished, and in no distress.  HENT:  Head: Normocephalic and atraumatic.  Mouth/Throat: Oropharynx is clear and moist.  Neurological: She is alert and oriented to person, place, and time. Gait normal.  Skin: Skin is warm and dry.      ASSESSMENT and PLAN:  1. Encounter for management and injection of depo-Provera Urine hcg negative.  depo given today, depo r/se/b discussed. Next depo due Dec 3rd-17th, nurse visit ok.  - medroxyPROGESTERone (DEPO-PROVERA) injection 150 mg; Inject 1 mL (150 mg total) into the muscle once.       Myles Lipps, MD Primary Care at [facility] [address] [place], [place] [address] Ph.  [phone] Fax [phone]

## 2016-10-15 ENCOUNTER — Other Ambulatory Visit: Payer: Self-pay | Admitting: Physician Assistant

## 2016-10-15 ENCOUNTER — Telehealth: Payer: Self-pay | Admitting: Physician Assistant

## 2016-10-15 DIAGNOSIS — A5901 Trichomonal vulvovaginitis: Secondary | ICD-10-CM

## 2016-10-15 MED ORDER — METRONIDAZOLE 500 MG PO TABS: 1000.0000 mg | ORAL_TABLET | Freq: Two times a day (BID) | ORAL | 0 refills | Status: DC

## 2016-10-15 NOTE — Progress Notes (Signed)
Discussed with patient directly on [phone] personal number Advised no sexual activity with partner for 7-10 days. Advised partner to be treated and waiting 7 days to prevent re-infection.

## 2016-11-11 NOTE — Telephone Encounter (Signed)
None needed

## 2017-02-08 ENCOUNTER — Ambulatory Visit: Payer: 59 | Admitting: Physician Assistant

## 2017-02-08 ENCOUNTER — Encounter: Payer: Self-pay | Admitting: Physician Assistant

## 2017-02-08 ENCOUNTER — Ambulatory Visit (INDEPENDENT_AMBULATORY_CARE_PROVIDER_SITE_OTHER): Payer: BLUE CROSS/BLUE SHIELD | Admitting: Physician Assistant

## 2017-02-08 VITALS — HR 81 | Temp 98.9°F | Resp 16 | Ht 65.0 in | Wt 282.0 lb

## 2017-02-08 DIAGNOSIS — Z308 Encounter for other contraceptive management: Secondary | ICD-10-CM

## 2017-02-08 LAB — POCT URINE PREGNANCY: Preg Test, Ur: NEGATIVE

## 2017-02-08 MED ORDER — MEDROXYPROGESTERONE ACETATE 150 MG/ML IM SUSY
150.0000 mg | PREFILLED_SYRINGE | Freq: Once | INTRAMUSCULAR | Status: AC
  Administered 2017-02-08: 150 mg via INTRAMUSCULAR

## 2017-02-08 NOTE — Patient Instructions (Signed)
     IF you received an x-ray today, you will receive an invoice from  Radiology. Please contact  Radiology at [phone] with questions or concerns regarding your invoice.   IF you received labwork today, you will receive an invoice from [facility]. Please contact [facility] at [phone] with questions or concerns regarding your invoice.   Our billing staff will not be able to assist you with questions regarding bills from these companies.  You will be contacted with the lab results as soon as they are available. The fastest way to get your results is to activate your My Chart account. Instructions are located on the last page of this paperwork. If you have not heard from us regarding the results in 2 weeks, please contact this office.     

## 2017-02-08 NOTE — Progress Notes (Signed)
Depo provera injection only.

## 2017-05-01 ENCOUNTER — Ambulatory Visit (INDEPENDENT_AMBULATORY_CARE_PROVIDER_SITE_OTHER): Payer: BLUE CROSS/BLUE SHIELD

## 2017-05-01 DIAGNOSIS — Z308 Encounter for other contraceptive management: Secondary | ICD-10-CM

## 2017-05-01 MED ORDER — MEDROXYPROGESTERONE ACETATE 150 MG/ML IM SUSP
150.0000 mg | Freq: Once | INTRAMUSCULAR | Status: AC
  Administered 2017-05-01: 150 mg via INTRAMUSCULAR

## 2017-05-01 NOTE — Progress Notes (Signed)
After obtaining consent, and per orders of Dr. Leretha PolSantiago, injection of Depo Provera 150mg  given Left deltoid IM by Oneida AlarElizabeth Ephriam Turman. She plans to return in June for Nexplanon.

## 2017-05-02 ENCOUNTER — Other Ambulatory Visit: Payer: Self-pay

## 2017-05-02 ENCOUNTER — Encounter: Payer: Self-pay | Admitting: Family Medicine

## 2017-05-02 ENCOUNTER — Ambulatory Visit: Payer: BLUE CROSS/BLUE SHIELD | Admitting: Family Medicine

## 2017-05-02 VITALS — BP 120/72 | HR 86 | Temp 98.6°F | Resp 17 | Ht 65.0 in | Wt 279.2 lb

## 2017-05-02 DIAGNOSIS — H5789 Other specified disorders of eye and adnexa: Secondary | ICD-10-CM | POA: Diagnosis not present

## 2017-05-02 MED ORDER — TOBRAMYCIN 0.3 % OP SOLN: 1.0000 [drp] | OPHTHALMIC | 0 refills | Status: DC

## 2017-05-02 NOTE — Patient Instructions (Signed)
1. Please see your regular eye doctor on Monday, however if your eye if not improving, becoming more painful, worsening vision, please go to the ER.

## 2017-05-10 ENCOUNTER — Encounter: Payer: Self-pay | Admitting: Family Medicine

## 2017-05-10 NOTE — Progress Notes (Signed)
   3/30/20193:20 PM  Cathy Flores 06/16/1975, 42 y.o. female [id number]  Chief Complaint  Patient presents with  . Conjunctivitis    onset: 05/01/2017, eye matted shut upon waking up this morning, redness and swelling in right eye    HPI:   Patient is a 42 y.o. female with past medical history significant for wears contact lenses who presents today for acute onset of right red eye with drainage. Denies any recent URI or allergies. Reports drainage as thick yellow. Reports pain, she describes that it hurts to keep her eye open, she denies any pain with movement. Denies any changes in vision, FB sensation, photophobia. Denies any fever or chills.  [facility] 05/02/2017 02/08/2017 09/27/2016  Decreased Interest 0 0 0  Down, Depressed, Hopeless 0 0 0  PHQ - 2 Score 0 0 0    No Known Allergies  Prior to Admission medications   Medication Sig Start Date End Date Taking? Authorizing Provider  medroxyPROGESTERone (DEPO-PROVERA) 150 MG/ML injection Inject 150 mg into the muscle every 3 (three) months.   Yes [provider]  tobramycin (TOBREX) 0.3 % ophthalmic solution Place 1 drop into the right eye every 4 (four) hours. 05/02/17   Myles LippsSantiago, Daria Mcmeekin M, MD    Past Medical History:  Diagnosis Date  . Anxiety   . Obesity   . Panic disorder   . Secondary amenorrhea     Past Surgical History:  Procedure Laterality Date  . CESAREAN SECTION      Social History   Tobacco Use  . Smoking status: Never Smoker  . Smokeless tobacco: Never Used  Substance Use Topics  . Alcohol use: No    Family History  Problem Relation Age of Onset  . Blindness Father   . Heart disease Paternal Grandfather   . Arthritis Paternal Grandfather     ROS Per hpi  OBJECTIVE:  Blood pressure 120/72, pulse 86, temperature 98.6 F (37 C), temperature source Oral, resp. rate 17, height 5\' 5"  (1.651 m), weight 279 lb 3.2 oz (126.6 kg), SpO2 96 %.   Visual Acuity Screening   Right  eye Left eye Both eyes  Without correction: 20/30 20/13 20/13   With correction:       Physical Exam  Constitutional: She is well-developed, well-nourished, and in no distress.  HENT:  Head: Normocephalic and atraumatic.  Mouth/Throat: Oropharynx is clear and moist.  Eyes: Pupils are equal, round, and reactive to light. EOM are normal. Right eye exhibits chemosis and discharge. No foreign body present in the right eye. Left eye exhibits no chemosis and no discharge. Right conjunctiva is injected. Left conjunctiva is not injected.  Slit lamp exam:      The right eye shows no fluorescein uptake.      ASSESSMENT and PLAN  1. Redness of right eye Patient with no decreased vision in affected eye and no fluorescin uptake. Treating as bacterial conjunctivitis given contact lens wearer. Advised not to use contacts while infection present. Advised follow-up with her eye doctor in 1-2 days. RTC precautions reviewed.  - tobramycin (TOBREX) 0.3 % ophthalmic solution; Place 1 drop into the right eye every 4 (four) hours.  Return if symptoms worsen or fail to improve.    Myles LippsIrma M Santiago, MD [facility] at [facility] [address] [place], [place] [address] Ph.  [phone] Fax [phone]

## 2017-05-13 ENCOUNTER — Telehealth: Payer: Self-pay

## 2017-05-13 NOTE — Telephone Encounter (Signed)
-----   Message from Myles LippsIrma M Santiago, MD sent at 05/10/2017  5:50 PM EDT ----- Please have her make an appointment with me to discuss nexplanon and when to transition from depo to other form of birth control. I dont just place nexplanon without having conversations first. She might want an IUD...thanks  ----- Message ----- From: Oneida AlarPeterson, Kensy Blizard, RN Sent: 05/01/2017  11:25 AM To: Myles LippsIrma M Santiago, MD  She wants to come in for Nexplanon insertion in June. Do you want to check if her insurance will cover this?

## 2017-05-13 NOTE — Telephone Encounter (Signed)
Phone call to patient, not available at time of call.  When patient calls back, please schedule her for contraceptive counseling visit per Dr. Leretha PolSantiago before June 2019 to discuss Nexplanon placement.

## 2017-05-14 ENCOUNTER — Encounter: Payer: Self-pay | Admitting: Physician Assistant

## 2017-07-26 ENCOUNTER — Ambulatory Visit (INDEPENDENT_AMBULATORY_CARE_PROVIDER_SITE_OTHER): Payer: BLUE CROSS/BLUE SHIELD | Admitting: Family Medicine

## 2017-07-26 DIAGNOSIS — Z308 Encounter for other contraceptive management: Secondary | ICD-10-CM | POA: Diagnosis not present

## 2017-07-26 MED ORDER — MEDROXYPROGESTERONE ACETATE 150 MG/ML IM SUSP
150.0000 mg | Freq: Once | INTRAMUSCULAR | Status: AC
  Administered 2017-07-26: 150 mg via INTRAMUSCULAR

## 2017-07-26 NOTE — Progress Notes (Signed)
Depo-Provera shot

## 2017-09-18 ENCOUNTER — Telehealth: Payer: Self-pay | Admitting: Family Medicine

## 2017-09-18 NOTE — Telephone Encounter (Signed)
Copied from CRM [id number]. Topic: Appointment Scheduling - Scheduling Inquiry for Clinic >> Sep 18, 2017  8:24 AM Jolayne Hainesaylor, Brittany L wrote: Reason for CRM: Patient is wanting to know when she is due for her next depo shot. She was in on 08/01/17. [phone]

## 2017-09-23 NOTE — Telephone Encounter (Signed)
Spoke with pt advised depo due between 9/6-9/20/2019.  Pt agreeable and appreciative of call back. Dgaddy,CMA
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
## 2019-05-26 ENCOUNTER — Other Ambulatory Visit: Payer: Self-pay

## 2019-05-26 ENCOUNTER — Ambulatory Visit: Payer: BLUE CROSS/BLUE SHIELD | Admitting: Adult Health Nurse Practitioner

## 2019-05-27 ENCOUNTER — Encounter: Payer: Self-pay | Admitting: Adult Health Nurse Practitioner

## 2020-04-19 ENCOUNTER — Encounter: Payer: Self-pay | Admitting: Family Medicine

## 2021-01-17 ENCOUNTER — Ambulatory Visit (HOSPITAL_COMMUNITY): Payer: Self-pay

## 2021-01-22 ENCOUNTER — Emergency Department (HOSPITAL_COMMUNITY)
Admission: EM | Admit: 2021-01-22 | Discharge: 2021-01-22 | Disposition: A | Discharge status: Home / Self Care | Payer: Self-pay | Attending: Emergency Medicine | Admitting: Emergency Medicine

## 2021-01-22 ENCOUNTER — Emergency Department (HOSPITAL_COMMUNITY): Payer: Self-pay

## 2021-01-22 ENCOUNTER — Encounter (HOSPITAL_COMMUNITY): Payer: Self-pay

## 2021-01-22 ENCOUNTER — Other Ambulatory Visit: Payer: Self-pay

## 2021-01-22 ENCOUNTER — Ambulatory Visit (HOSPITAL_COMMUNITY): Admit: 2021-01-22 | Disposition: A | Discharge status: Other Acute Inpatient Hospital | Payer: Self-pay

## 2021-01-22 DIAGNOSIS — R42 Dizziness and giddiness: Secondary | ICD-10-CM | POA: Insufficient documentation

## 2021-01-22 DIAGNOSIS — Z5321 Procedure and treatment not carried out due to patient leaving prior to being seen by health care provider: Secondary | ICD-10-CM | POA: Insufficient documentation

## 2021-01-22 DIAGNOSIS — R519 Headache, unspecified: Secondary | ICD-10-CM | POA: Insufficient documentation

## 2021-01-22 NOTE — ED Notes (Signed)
PT decided to leave . 

## 2021-01-22 NOTE — ED Triage Notes (Signed)
Pt here for a headache that's been ongoing for the past 10 days. Pt states there is a dull pain in the center of her head. Patient has tried to manage pain with ibuprofen and excedrin over the course of 10 days and medication has not been effective. Pt states that headache makes her feel near faint. Pt states that she also has accompanied light headedness. Pt stated that she may get a slight tingling on the right side.

## 2021-01-22 NOTE — ED Provider Notes (Signed)
Emergency Medicine Provider Triage Evaluation Note  Cathy Flores , a 45 y.o. female  was evaluated in triage.  Pt complains of headache described as a bandlike distribution along the forehead has been present for the last week.  She has tried ibuprofen and Excedrin with little relief.  She does not have a history of headaches.  She denies nasal congestion, focal weakness, numbness, urinary incontinence.  She does report associated dizziness characterizes a lightheaded sensation and unsteady sensation on her feet.  Review of Systems  Positive:  Negative: See above   Physical Exam  BP 113/83 (BP Location: Right Arm)   Pulse 71   Temp 99.6 F (37.6 C) (Oral)   Resp 20   SpO2 99%  Gen:   Awake, no distress   Resp:  Normal effort  MSK:   Moves extremities without difficulty  Other:  Cranial nerves II through XII are intact.  5/5 strength to the upper lower extremities.  Normal sensation to the upper and lower extremities.  Medical Decision Making  Medically screening exam initiated at 6:22 PM.  Appropriate orders placed.  Cathy Flores was informed that the remainder of the evaluation will be completed by another provider, this initial triage assessment does not replace that evaluation, and the importance of remaining in the ED until their evaluation is complete.     Honor Loh Arthur, PA-C 01/22/21 1823    Tegeler, Canary Brim, MD 01/22/21 [phone]

## 2021-01-23 ENCOUNTER — Encounter: Payer: Self-pay | Admitting: Family Medicine

## 2021-08-07 ENCOUNTER — Encounter (HOSPITAL_COMMUNITY): Payer: Self-pay

## 2021-08-07 ENCOUNTER — Emergency Department (HOSPITAL_COMMUNITY): Payer: Self-pay

## 2021-08-07 ENCOUNTER — Emergency Department (HOSPITAL_COMMUNITY)
Admission: EM | Admit: 2021-08-07 | Discharge: 2021-08-07 | Disposition: A | Discharge status: Home / Self Care | Payer: Self-pay | Attending: Emergency Medicine | Admitting: Emergency Medicine

## 2021-08-07 ENCOUNTER — Other Ambulatory Visit: Payer: Self-pay

## 2021-08-07 DIAGNOSIS — M5412 Radiculopathy, cervical region: Secondary | ICD-10-CM

## 2021-08-07 DIAGNOSIS — M79602 Pain in left arm: Secondary | ICD-10-CM | POA: Insufficient documentation

## 2021-08-07 DIAGNOSIS — M5020 Other cervical disc displacement, unspecified cervical region: Secondary | ICD-10-CM

## 2021-08-07 DIAGNOSIS — R202 Paresthesia of skin: Secondary | ICD-10-CM | POA: Insufficient documentation

## 2021-08-07 MED ORDER — PREDNISONE 10 MG (21) PO TBPK: ORAL_TABLET | Freq: Every day | ORAL | 0 refills | Status: DC

## 2021-08-07 MED ORDER — LIDOCAINE 5 % EX PTCH
2.0000 | MEDICATED_PATCH | CUTANEOUS | Status: DC
  Administered 2021-08-07: 2 via TRANSDERMAL
  Filled 2021-08-07: qty 2

## 2021-08-07 MED ORDER — LIDOCAINE 5 % EX PTCH: 1.0000 | MEDICATED_PATCH | Freq: Every day | CUTANEOUS | 0 refills | Status: DC | PRN

## 2021-08-07 MED ORDER — OXYCODONE-ACETAMINOPHEN 5-325 MG PO TABS
1.0000 | ORAL_TABLET | Freq: Once | ORAL | Status: AC
  Administered 2021-08-07: 1 via ORAL
  Filled 2021-08-07: qty 1

## 2021-08-07 MED ORDER — METHOCARBAMOL 500 MG PO TABS: 500.0000 mg | ORAL_TABLET | Freq: Three times a day (TID) | ORAL | 0 refills | Status: DC | PRN

## 2021-08-07 NOTE — ED Provider Notes (Addendum)
[facility] EMERGENCY DEPARTMENT Provider Note   CSN: [id number] Arrival date & time: 08/07/21  0231     History  Chief Complaint  Patient presents with   Arm Pain    Abbeygail Freund is a 46 y.o. female with a history of anxiety who presents to the emergency department with complaints of left upper extremity pain intermittently for the past couple of months, constant over the past 24 hours.  Patient states that frequently at night she gets the pain in the left neck that radiates down the left upper extremity with some paresthesias to her hand as well as some weakness in the hand leading to dropping things.  She states that this typically resolves fairly quickly, however over the past 24 hours it has been constant.  Worse with certain movements/positions, no alleviating factors.  Denies specific traumatic injury.  Denies any additional numbness/weakness.  She denies headache, change in vision, vomiting, chest pain, shortness of breath, saddle anesthesia, incontinence to bowel/bladder, fever, chills, IV drug use, or hx of cancer. Patient has not had prior neck surgeries   HPI     Home Medications Prior to Admission medications   Medication Sig Start Date End Date Taking? Authorizing Provider  tobramycin (TOBREX) 0.3 % ophthalmic solution Place 1 drop into the right eye every 4 (four) hours. 05/02/17   Noni Saupe, MD      Allergies    Patient has no known allergies.    Review of Systems   Review of Systems  Constitutional:  Negative for chills and fever.  Gastrointestinal:  Negative for abdominal pain and vomiting.  Genitourinary:  Negative for dysuria and hematuria.  Musculoskeletal:  Positive for neck pain.  Neurological:  Positive for weakness and numbness.  All other systems reviewed and are negative.   Physical Exam Updated Vital Signs BP 106/71   Pulse 70   Temp 98.3 F (36.8 C) (Oral)   Resp 17   Ht 5\' 4"  (1.626 m)   Wt 127 kg   SpO2 98%    BMI 48.06 kg/m  Physical Exam Vitals and nursing note reviewed.  Constitutional:      General: She is not in acute distress.    Appearance: Normal appearance. She is well-developed. She is not ill-appearing or toxic-appearing.  HENT:     Head: Normocephalic and atraumatic.  Eyes:     General:        Right eye: No discharge.        Left eye: No discharge.     Conjunctiva/sclera: Conjunctivae normal.  Neck:     Comments: No midline tenderness.  Cardiovascular:     Rate and Rhythm: Normal rate and regular rhythm.     Pulses:          Radial pulses are 2+ on the right side and 2+ on the left side.  Pulmonary:     Effort: No respiratory distress.     Breath sounds: Normal breath sounds. No wheezing or rales.  Abdominal:     General: There is no distension.     Palpations: Abdomen is soft.     Tenderness: There is no abdominal tenderness.  Musculoskeletal:     Cervical back: Normal range of motion and neck supple. Muscular tenderness (left sided) present. No spinous process tenderness.     Comments: Upper extremities: No obvious deformity, appreciable swelling, edema, erythema, ecchymosis, warmth, or open wounds. Patient has intact AROM throughout with the exception of left shoulder limitation in  flexion and abduction to about 90 degrees. Tender to palpation left trapezius muscle, diffuse glenohumeral joint, and upper arm.  Compartments are soft.  Otherwise nontender. Back: No midline tenderness.   Skin:    General: Skin is warm and dry.     Capillary Refill: Capillary refill takes less than 2 seconds.  Neurological:     Mental Status: She is alert.     Comments: Alert. Clear speech.  Patient report subjective decrease sensation to the left hand compared to the right, able to discriminate sharp/dull.  Left grip & elbow flexion/extension very mildly decreased compared to the right upper extremity, slight increase in this weakness with shoulder flexion strength in the left upper extremity  compared to the right upper extremity..  Patient is able to perform each of these movements against resistance.  Sensation grossly intact bilateral lower extremities.  5-5 strength with plantar and dorsiflexion.  Ambulatory.  Psychiatric:        Mood and Affect: Mood normal.        Behavior: Behavior normal.     ED Results / Procedures / Treatments   Labs (all labs ordered are listed, but only abnormal results are displayed) Labs Reviewed - No data to display  EKG None  Radiology MR Cervical Spine Wo Contrast  Result Date: 08/07/2021 CLINICAL DATA:  Acute myelopathy. Right upper extremity paresthesia and weakness EXAM: MRI CERVICAL SPINE WITHOUT CONTRAST TECHNIQUE: Multiplanar, multisequence MR imaging of the cervical spine was performed. No intravenous contrast was administered. COMPARISON:  Cervical spine CT from earlier today FINDINGS: Alignment: Straightening of cervical lordosis Vertebrae: No fracture, evidence of discitis, or bone lesion. Cord: Normal signal and morphology. Posterior Fossa, vertebral arteries, paraspinal tissues: Negative. Disc levels: C5-6 small left paracentral protrusion. Mild left C7-T1 facet spurring. No neural impingement. IMPRESSION: 1. Normal MRI of the cervical cord. 2. C5-6 small left paracentral protrusion.  No neural impingement. Electronically Signed   By: Tiburcio Pea M.D.   On: 08/07/2021 06:46   CT Cervical Spine Wo Contrast  Result Date: 08/07/2021 CLINICAL DATA:  Neck trauma with neck pain and paresthesias. EXAM: CT CERVICAL SPINE WITHOUT CONTRAST TECHNIQUE: Multidetector CT imaging of the cervical spine was performed without intravenous contrast. Multiplanar CT image reconstructions were also generated. RADIATION DOSE REDUCTION: This exam was performed according to the departmental dose-optimization program which includes automated exposure control, adjustment of the mA and/or kV according to patient size and/or use of iterative reconstruction  technique. COMPARISON:  None Available. FINDINGS: Technical note: The images are grainy due to superimposition of the patient's shoulders causing beam hardening artifact, from C4 down. Alignment: There is a slight levoscoliosis and a straightened lordosis. No AP listhesis is seen. There is narrowing and osteophytic spurring of the anterior atlantodental joint. Skull base and vertebrae: No displaced fracture or spinal compression injury is seen within the limits of the exam. There is no destructive bone lesion. Soft tissues and spinal canal: No prevertebral fluid or swelling. No visible canal hematoma. The parotid glands are fatty replaced but symmetric. Disc levels: There is minimal to mild disc space loss at C4-5 and C5-6 where there are small bidirectional osteophytes with posterior disc osteophyte complexes at these levels mildly effacing the ventral CSF. No levels show significant soft tissue or bony encroachment on the cord. No herniated discs or cord compromise are observed. Upper chest: Negative. Other: None. IMPRESSION: 1. Slight levoscoliosis and a straightened lordosis. No AP listhesis. 2. Limited image resolution below the level of C4. No acute  fracture is suspected allowing for study limitations. 3. Mild degenerative features. Electronically Signed   By: Almira Bar M.D.   On: 08/07/2021 04:47    Procedures Procedures    Medications Ordered in ED Medications  lidocaine (LIDODERM) 5 % 2 patch (2 patches Transdermal Patch Applied 08/07/21 0559)  oxyCODONE-acetaminophen (PERCOCET/ROXICET) 5-325 MG per tablet 1 tablet (1 tablet Oral Given 08/07/21 0557)    ED Course/ Medical Decision Making/ A&P                           Medical Decision Making Amount and/or Complexity of Data Reviewed Radiology: ordered.  Risk Prescription drug management.   Patient presents to the emergency department with complaints of left-sided neck/upper extremity pain, numbness, weakness.  She is nontoxic,  resting comfortably, vitals are within normal limits.  Symptoms localized to the neck/left upper extremity, some very mild weakness in the left upper extremity compared to the right upper extremity, sensation grossly intact to light touch however subjectively decreased in the left hand compared to the right.  Suspect some of her left upper extremity weakness may be pain related as she does have discomfort with movement of the shoulder.  No direct trauma to raise concern for fracture or dislocation.  She has no history of IVDU and is afebrile I have a low suspicion for epidural abscess, no overlying erythema to suggest cellulitis or septic joint.  Given localized to the left upper extremity do not suspect acute CVA.   CT was ordered in triage, reviewed imaging, per radiologist: 1. Slight levoscoliosis and a straightened lordosis. No AP listhesis. 2. Limited image resolution below the level of C4. No acute fracture is suspected allowing for study limitations. 3. Mild degenerative features.  Given her neurologic symptoms/exam- I discussed risk/benefits of MRI in the emergency department, patient would prefer to receive this which I feel is reasonable.  MRI ordered.  Patient care signed out to PA Blue at shift change pending MRI and disposition, if no significant surgical process anticipate discharge on steroids.   Discussed w/ attending- in agreement.    Final Clinical Impression(s) / ED Diagnoses Final diagnoses:  None    Rx / DC Orders ED Discharge Orders     None         Cherly Anderson, PA-C 08/07/21 7616  MRI resulted prior to my leaving shift change.  MRI C spine: 1. Normal MRI of the cervical cord. 2. C5-6 small left paracentral protrusion.  No neural impingement  No significant neural impingement.  On reassessment patient remains resting comfortably.  Overall feels she is reasonable for discharge with supportive care and outpatient neurosurgery follow-up.  I discussed  results, treatment plan, need for follow-up, and return precautions with the patient and her husband at bedside, Friday opportunity for questions, they have confirmed understanding and are in agreement.   Desmond Lope 08/07/21 0737    Shon Baton, MD 08/08/21 [phone]

## 2021-08-07 NOTE — ED Notes (Signed)
Patient transported to MRI 

## 2024-02-02 ENCOUNTER — Ambulatory Visit: Admission: EM | Admit: 2024-02-02 | Discharge: 2024-02-02 | Disposition: A | Discharge status: Home / Self Care

## 2024-02-02 ENCOUNTER — Other Ambulatory Visit: Payer: Self-pay

## 2024-02-02 DIAGNOSIS — M5412 Radiculopathy, cervical region: Secondary | ICD-10-CM

## 2024-02-02 DIAGNOSIS — L02212 Cutaneous abscess of back [any part, except buttock]: Secondary | ICD-10-CM | POA: Diagnosis not present

## 2024-02-02 MED ORDER — PREDNISONE 10 MG (21) PO TBPK: ORAL_TABLET | Freq: Every day | ORAL | 0 refills | Status: AC

## 2024-02-02 MED ORDER — DOXYCYCLINE HYCLATE 100 MG PO TABS: 100.0000 mg | ORAL_TABLET | Freq: Two times a day (BID) | ORAL | 0 refills | Status: DC

## 2024-02-02 MED ORDER — DOXYCYCLINE HYCLATE 100 MG PO TABS: 100.0000 mg | ORAL_TABLET | Freq: Two times a day (BID) | ORAL | 0 refills | Status: AC

## 2024-02-02 MED ORDER — PREDNISONE 10 MG (21) PO TBPK: ORAL_TABLET | Freq: Every day | ORAL | 0 refills | Status: DC

## 2024-02-02 NOTE — ED Triage Notes (Signed)
 Pt presents with c/o abscess to posterior mid-back. Noticed about two days ago. Rates pain in this area an 8/10. It is constant. Pt also mentions numbness and decreased mobility in left arm. Describes as 8/10, constant pain. Noticed this yesterday. States the last time she had the arm numbness sxs, was prescribed prednisone  and it resolved.

## 2024-02-02 NOTE — ED Provider Notes (Signed)
 [facility]    CSN: [id number] Arrival date & time: 02/02/24  1856      History   Chief Complaint Chief Complaint  Patient presents with   Abscess   Numbness    HPI Cathy Flores is a 48 y.o. female.   Patient presents to clinic over concern of abscess to the lower back and left arm numbness that radiates from the neck to the fingertips.  Patient was seen at a local emergency department in 2023 with similar left arm numbness that radiates from the neck down to the fingers.  Was diagnosed with cervical radiculopathy after CT scan.  Will have intermittent flares of this but usually they are manageable with 800 mg of ibuprofen.  Over the past few days she noticed that this flare seems to be worse than usual.  She has not had any trauma, falls or injuries.  When she types the entire arm hurts from her neck down to her fingers.  She has changes in sensation and numbness, but is still able to feel.  Has a spot to the lower back that she is concerned may be infected.  Initially this was a blackhead and she was able to fully drain it, this was a while ago.  Over the past few days the area has become inflamed, tender and painful.  Has not had any drainage.  Did use a drawing salve on it without much improvement.   The history is provided by the patient and medical records.  Abscess   Past Medical History:  Diagnosis Date   Anxiety    Obesity    Panic disorder    Secondary amenorrhea     Patient Active Problem List   Diagnosis Date Noted   Anxiety 06/11/2011   Panic disorder 06/11/2011   Headache 06/11/2011   Secondary amenorrhea 06/11/2011   Cough 12/21/2010   Blood in sputum 12/21/2010    Past Surgical History:  Procedure Laterality Date   CESAREAN SECTION      OB History   No obstetric history on file.      Home Medications    Prior to Admission medications  Medication Sig Start Date End Date Taking? Authorizing Provider  doxycycline  (VIBRA -TABS)  100 MG tablet Take 1 tablet (100 mg total) by mouth 2 (two) times daily for 7 days. 02/02/24 02/09/24 Yes Aleczander Fandino  N, FNP  predniSONE  (STERAPRED UNI-PAK 21 TAB) 10 MG (21) TBPK tablet Take by mouth daily. Take as prescribed, in the morning with breakfast 02/02/24  Yes Carmino Ocain  N, FNP  diphenhydrAMINE (BENADRYL) 25 mg capsule Take 25 mg by mouth.    [provider]    Family History Family History  Problem Relation Age of Onset   Blindness Father    Heart disease Paternal Grandfather    Arthritis Paternal Grandfather     Social History Social History[1]   Allergies   Patient has no known allergies.   Review of Systems Review of Systems  Per HPI  Physical Exam Triage Vital Signs ED Triage Vitals  Encounter Vitals Group     BP 02/02/24 1939 121/84     Girls Systolic BP Percentile --      Girls Diastolic BP Percentile --      Boys Systolic BP Percentile --      Boys Diastolic BP Percentile --      Pulse Rate 02/02/24 1939 76     Resp 02/02/24 1939 16     Temp 02/02/24 1939 98.4  F (36.9 C)     Temp Source 02/02/24 1939 Oral     SpO2 02/02/24 1939 98 %     Weight 02/02/24 1939 284 lb (128.8 kg)     Height 02/02/24 1946 5' 4 (1.626 m)     Head Circumference --      Peak Flow --      Pain Score 02/02/24 1943 8     Pain Loc --      Pain Education --      Exclude from Growth Chart --    No data found.  Updated Vital Signs BP 121/84 (BP Location: Right Arm)   Pulse 76   Temp 98.4 F (36.9 C) (Oral)   Resp 16   Ht 5' 4 (1.626 m)   Wt 284 lb (128.8 kg)   LMP 01/04/2024 (Exact Date)   SpO2 98%   BMI 48.75 kg/m   Visual Acuity Right Eye Distance:   Left Eye Distance:   Bilateral Distance:    Right Eye Near:   Left Eye Near:    Bilateral Near:     Physical Exam Vitals and nursing note reviewed.  Constitutional:      Appearance: Normal appearance.  HENT:     Head: Normocephalic and atraumatic.     Right Ear: External ear  normal.     Left Ear: External ear normal.     Nose: Nose normal.     Mouth/Throat:     Mouth: Mucous membranes are moist.  Eyes:     Conjunctiva/sclera: Conjunctivae normal.  Cardiovascular:     Rate and Rhythm: Normal rate.  Pulmonary:     Effort: Pulmonary effort is normal. No respiratory distress.  Musculoskeletal:        General: Normal range of motion.     Cervical back: Normal.  Skin:    General: Skin is warm and dry.     Findings: Abscess present.      Neurological:     General: No focal deficit present.     Mental Status: She is alert and oriented to person, place, and time.     GCS: GCS eye subscore is 4. GCS verbal subscore is 5. GCS motor subscore is 6.     Cranial Nerves: Cranial nerves 2-12 are intact.     Sensory: Sensation is intact.     Motor: Motor function is intact. No weakness.     Gait: Gait is intact.  Psychiatric:        Mood and Affect: Mood normal.        Behavior: Behavior normal. Behavior is cooperative.      UC Treatments / Results  Labs (all labs ordered are listed, but only abnormal results are displayed) Labs Reviewed - No data to display  EKG   Radiology No results found.  Procedures Procedures (including critical care time)  Medications Ordered in UC Medications - No data to display  Initial Impression / Assessment and Plan / UC Course  I have reviewed the triage vital signs and the nursing notes.  Pertinent labs & imaging results that were available during my care of the patient were reviewed by me and considered in my medical decision making (see chart for details).  Vitals and triage reviewed, patient is hemodynamically stable.  Bilateral upper extremities with 5 out of 5 strength.  Brisk radial pulse of the left arm.  Range of motion does induce pain.  Appears to have flareup of cervical radiculopathy, will treat with steroid taper  and encourage Ortho follow-up.  Imaging deferred, atraumatic.  Abscess to the lower back  is erythematous and tender.  Around 2 cm in duration.  Will send in oral antibiotics and encourage warm compresses.  Can return to clinic if no improvement, at that time we could consider incision and drainage.  Plan of care, follow-up care return precautions given, no questions at this time.    Final Clinical Impressions(s) / UC Diagnoses   Final diagnoses:  Abscess of lower back  Cervical radiculopathy     Discharge Instructions      For the abscess on your back take the antibiotics twice daily with food.  Warm compresses can help promote drainage.  The area may drain spontaneously or heal without drainage.  If the area does not improve please return to clinic by Friday for recheck.  You can take Tylenol  as needed for pain.  For the cervical radiculopathy I have sent in a steroid taper pack.  Take this daily with breakfast.  If this persist please follow-up with orthopedic for specialized evaluation.  Return to clinic for new or urgent symptoms.      ED Prescriptions     Medication Sig Dispense Auth. Provider   doxycycline  (VIBRA -TABS) 100 MG tablet Take 1 tablet (100 mg total) by mouth 2 (two) times daily for 7 days. 14 tablet Kursten Kruk  N, FNP   predniSONE  (STERAPRED UNI-PAK 21 TAB) 10 MG (21) TBPK tablet Take by mouth daily. Take as prescribed, in the morning with breakfast 21 tablet Dreama, Jere Vanburen  N, FNP      PDMP not reviewed this encounter.     [1]  Social History Tobacco Use   Smoking status: Never   Smokeless tobacco: Never  Vaping Use   Vaping status: Never Used  Substance Use Topics   Alcohol use: Yes    Comment: socially   Drug use: No     Dreama Jearld SAILOR, FNP 02/02/24 2014  "

## 2024-02-02 NOTE — Discharge Instructions (Signed)
 For the abscess on your back take the antibiotics twice daily with food.  Warm compresses can help promote drainage.  The area may drain spontaneously or heal without drainage.  If the area does not improve please return to clinic by Friday for recheck.  You can take Tylenol  as needed for pain.  For the cervical radiculopathy I have sent in a steroid taper pack.  Take this daily with breakfast.  If this persist please follow-up with orthopedic for specialized evaluation.  Return to clinic for new or urgent symptoms.
